# Patient Record
Sex: Female | Born: 1945 | Race: White | Hispanic: No | Marital: Married | Smoking: Never smoker
Health system: Southern US, Community
[De-identification: ages and names within clinical notes are randomized; demographics above are authoritative.]

## PROBLEM LIST (undated history)

## (undated) DIAGNOSIS — I639 Cerebral infarction, unspecified: Secondary | ICD-10-CM

## (undated) HISTORY — PX: APPENDECTOMY: SHX54

---

## 2014-12-08 ENCOUNTER — Encounter: Payer: Self-pay | Admitting: Emergency Medicine

## 2014-12-08 ENCOUNTER — Ambulatory Visit: Payer: PRIVATE HEALTH INSURANCE

## 2014-12-08 ENCOUNTER — Ambulatory Visit
Admission: EM | Admit: 2014-12-08 | Discharge: 2014-12-08 | Disposition: A | Discharge status: Home / Self Care | Payer: PRIVATE HEALTH INSURANCE | Attending: Family Medicine | Admitting: Family Medicine

## 2014-12-08 DIAGNOSIS — S39012A Strain of muscle, fascia and tendon of lower back, initial encounter: Secondary | ICD-10-CM | POA: Diagnosis not present

## 2014-12-08 DIAGNOSIS — T148XXA Other injury of unspecified body region, initial encounter: Secondary | ICD-10-CM

## 2014-12-08 DIAGNOSIS — T148 Other injury of unspecified body region: Secondary | ICD-10-CM | POA: Diagnosis not present

## 2014-12-08 HISTORY — DX: Cerebral infarction, unspecified: I63.9

## 2014-12-08 MED ORDER — KETOROLAC TROMETHAMINE 10 MG PO TABS: 10.0000 mg | ORAL_TABLET | Freq: Four times a day (QID) | ORAL | Status: AC | PRN

## 2014-12-08 MED ORDER — KETOROLAC TROMETHAMINE 60 MG/2ML IM SOLN
30.0000 mg | Freq: Once | INTRAMUSCULAR | Status: AC
  Administered 2014-12-08: 30 mg via INTRAMUSCULAR

## 2014-12-08 MED ORDER — METAXALONE 800 MG PO TABS: 800.0000 mg | ORAL_TABLET | Freq: Three times a day (TID) | ORAL | Status: AC

## 2014-12-08 NOTE — ED Provider Notes (Signed)
CSN: [id number]     Arrival date & time 12/08/14  1122 History   First MD Initiated Contact with Patient 12/08/14 1200     Chief Complaint  Patient presents with  . Back Pain   (Consider location/radiation/quality/duration/timing/severity/associated sxs/prior Treatment) HPI Comments: Married caucasian female here with spouse today for evaluation of back pain after slipping in shower falling and hitting back on the toilet.  Had stroke in 2009 slight right sided weakness.  Having back spasms worried something may have gotten broken as history osteoporosis.  Denied loss of bowel/bladder control, saddle paresthesias, or arm/leg weakness.  Denied loss of consciousness or hitting head.  The history is provided by the patient and the spouse.    Past Medical History  Diagnosis Date  . Stroke    Past Surgical History  Procedure Laterality Date  . Appendectomy     History reviewed. No pertinent family history. Social History  Substance Use Topics  . Smoking status: Never Smoker   . Smokeless tobacco: None  . Alcohol Use: Yes   OB History    No data available     Review of Systems  Constitutional: Negative for fever, chills, diaphoresis, activity change, appetite change and fatigue.  HENT: Negative for congestion, dental problem, drooling, ear discharge, ear pain, facial swelling, hearing loss, tinnitus, trouble swallowing and voice change.   Eyes: Negative for photophobia, pain, discharge, redness, itching and visual disturbance.  Respiratory: Negative for cough, choking, shortness of breath, wheezing and stridor.   Cardiovascular: Negative for chest pain and leg swelling.  Gastrointestinal: Negative for nausea, vomiting, abdominal pain, diarrhea, constipation, blood in stool and abdominal distention.  Endocrine: Negative for cold intolerance and heat intolerance.  Genitourinary: Negative for dysuria, frequency, enuresis and difficulty urinating.  Musculoskeletal: Positive for myalgias  and back pain. Negative for joint swelling, arthralgias, gait problem, neck pain and neck stiffness.  Skin: Negative for color change, pallor, rash and wound.  Allergic/Immunologic: Negative for environmental allergies and food allergies.  Neurological: Positive for weakness. Negative for dizziness, tremors, seizures, syncope, facial asymmetry, speech difficulty, light-headedness, numbness and headaches.  Hematological: Negative for adenopathy. Does not bruise/bleed easily.  Psychiatric/Behavioral: Negative for behavioral problems, confusion, sleep disturbance and agitation.    Allergies  Review of patient's allergies indicates no known allergies.  Home Medications   Prior to Admission medications   Medication Sig Start Date End Date Taking? Authorizing Corby Villasenor  ketorolac (TORADOL) 10 MG tablet Take 1 tablet (10 mg total) by mouth every 6 (six) hours as needed for moderate pain (next dose bedtime). 12/08/14   Barbaraann Barthel, NP  metaxalone (SKELAXIN) 800 MG tablet Take 1 tablet (800 mg total) by mouth 3 (three) times daily. 12/08/14   Barbaraann Barthel, NP   Meds Ordered and Administered this Visit   Medications  ketorolac (TORADOL) injection 30 mg (30 mg Intramuscular Given 12/08/14 1223)  given by RN Gwenyth Bender  BP 148/65 mmHg  Pulse 63  Temp(Src) 98.1 F (36.7 C) (Tympanic)  Resp 18  Ht 5\' 3"  (1.6 m)  Wt 180 lb (81.647 kg)  BMI 31.89 kg/m2  SpO2 99% No data found.   Physical Exam  Constitutional: She is oriented to person, place, and time. Vital signs are normal. She appears well-developed and well-nourished. No distress.  HENT:  Head: Normocephalic and atraumatic.  Right Ear: External ear normal.  Left Ear: External ear normal.  Nose: Nose normal.  Mouth/Throat: Oropharynx is clear and moist. No oropharyngeal exudate.  Eyes: Conjunctivae,  EOM and lids are normal. Pupils are equal, round, and reactive to light. Right eye exhibits no discharge. Left eye exhibits no  discharge. No scleral icterus.  Neck: Trachea normal and normal range of motion. Neck supple. No tracheal deviation present.  Cardiovascular: Normal rate, regular rhythm, normal heart sounds and intact distal pulses.  Exam reveals no gallop and no friction rub.   No murmur heard. Pulmonary/Chest: Effort normal and breath sounds normal. No stridor. No respiratory distress. She has no wheezes. She has no rales.  Abdominal: Soft. Bowel sounds are normal. She exhibits no distension.  Musculoskeletal: She exhibits tenderness. She exhibits no edema.       Right shoulder: Normal.       Left shoulder: Normal.       Right elbow: Normal.      Left elbow: Normal.       Right wrist: Normal.       Left wrist: Normal.       Right hip: Normal.       Left hip: Normal.       Right knee: Normal.       Left knee: Normal.       Right ankle: Normal.       Left ankle: Normal.       Cervical back: Normal.       Thoracic back: She exhibits decreased range of motion, tenderness, pain and spasm. She exhibits no bony tenderness, no swelling, no edema, no deformity, no laceration and normal pulse.       Lumbar back: She exhibits decreased range of motion, tenderness, pain and spasm. She exhibits no bony tenderness, no swelling, no edema, no deformity, no laceration and normal pulse.       Back:       Right forearm: Normal.       Left forearm: Normal.       Right hand: Normal.       Left hand: Normal.       Right lower leg: Normal.       Left lower leg: Normal.  Patient in wheelchair during evaluation; ambulated to bathroom with assist of spouse; pain with flexion, rotation mid and low back; gait slow normal heel/toe no limping  Lymphadenopathy:    She has no cervical adenopathy.  Neurological: She is alert and oriented to person, place, and time. She has normal reflexes. She exhibits normal muscle tone. Coordination normal.  Skin: Skin is warm, dry and intact. No rash noted. She is not diaphoretic. No  erythema. No pallor.  Psychiatric: She has a normal mood and affect. Her speech is normal and behavior is normal. Judgment and thought content normal. Cognition and memory are normal.  Nursing note and vitals reviewed.   ED Course  Procedures (including critical care time)  Labs Review Labs Reviewed - No data to display  Imaging Review Dg Thoracic Spine 2 View  12/08/2014   CLINICAL DATA:  Larey Seat in bathroom today, injured right flank and rib area  EXAM: THORACIC SPINE 2 VIEWS  COMPARISON:  None.  FINDINGS: Multilevel degenerative disc disease throughout the thoracic spine with no evidence of fracture. No paraspinous hematoma. Normal alignment.  IMPRESSION: No acute findings.   Electronically Signed   By: Esperanza Heir M.D.   On: 12/08/2014 13:13   Dg Lumbar Spine Complete  12/08/2014   CLINICAL DATA:  Larey Seat on bathroom today injuring right flank/ribs.  EXAM: LUMBAR SPINE - COMPLETE 4+ VIEW  COMPARISON:  None.  FINDINGS: Diffuse  decreased bone mineralization. Mild curvature of the lumbar spine convex right. Vertebral body heights are within normal. There is mild spondylosis throughout the lumbar spine to include facet arthropathy over the lower lumbar spine. Moderate disc space narrowing with endplate sclerosis at the L5-S1 level. Mild degenerate change of the hips. There is atherosclerotic plaque of the abdominal aorta and iliac arteries.  IMPRESSION: No acute findings.  Mild spondylosis of the lumbar spine with disc disease at the L5-S1 level.   Electronically Signed   By: Elberta Fortis M.D.   On: 12/08/2014 13:11   Kotzbauer, Allyn Kenner (M.D.) - Tue Nov 13, 2009 12:00 AM PDT  LUMBAR SPINE   ** HISTORY **:  Low back pain   ** FINDINGS **:  The bones are osteopenic. No definite fracture or other acute  bony process identified. Alignment grossly maintained. Moderate  degenerative facet change at the L5-S1 level. Moderate disk space  narrowing also at this level. Mild degenerative  endplate change  throughout. Atherosclerosis noted in the abdominal aorta.   ** IMPRESSION **:  No acute process identified. Osteopenia. Moderate degenerative  change L5-S1. Aortic atherosclerosis.     Barnet Glasgow MD   DD: 11/13/2009 DT: 11/13/2009 [facility]  1320 Patient reported pain improved after toradol injection.  Avoid taking tylenol, motrin, advil, aleve, motrin, naproxen today.  Discussed xray results negative for fracture or dislocation with patient and spouse.  Given copy of xray report.  Results stable from previous 2011.  If no improvement or worsening of pain follow up with PCM for reimaging 7-10 days.  Patient traveling home via airplane tomorrow to [place].  Patient and spouse verbalized understanding of information/instructions and had no further questions at this time.   MDM   1. Contusion   2. Low back strain, initial encounter    Plan: 1. Test/x-ray results and diagnosis reviewed with patient and spouse 2. rx as per orders; risks, benefits, potential side effects reviewed with patient and spouse 3. Recommend supportive treatment with ice/heat, stretches, toradol and skelaxin 4. F/u prn if symptoms worsen or don't improve  For acute pain, rest, and intermittent application of heat (do not sleep on heating pad).  I discussed longer-term treatment plan of PRN toradol 10mg  po QID starting at bedtime tonight.  Skelaxin 800mg  po TID prn muscle spasms avoid alcohol intake and driving while on skelaxin as may cause drowsiness.  Patient had relief with IM Toradol in clinic today and ice.  I discussed a home back care exercise program with a strengthening and flexibility exercise.  Patient given [employer] handout on contusion and low back/mid back strain with rehab exercises.  Proper avoidance of heavy lifting discussed.  Consider physical therapy or chiropractic care and radiology if not improving and follow up with PCM next week.  Call or return to clinic as needed if these symptoms  worsen or fail to improve as anticipated especially leg weakness, loss of bowel/bladder control or saddle paresthesias.   Patient and spouse verbalized understanding of instructions/information and agreed with plan of care and had no further questions at this time.  P2:  Injury Prevention, fitness   Barbaraann Barthel, NP 12/08/14 1555

## 2014-12-08 NOTE — ED Notes (Signed)
Pt fell in bathroom hitting left upper back on a toilet

## 2014-12-08 NOTE — Discharge Instructions (Signed)
Back Exercises These exercises may help you when beginning to rehabilitate your injury. Your symptoms may resolve with or without further involvement from your physician, physical therapist or athletic trainer. While completing these exercises, remember:   Restoring tissue flexibility helps normal motion to return to the joints. This allows healthier, less painful movement and activity.  An effective stretch should be held for at least 30 seconds.  A stretch should never be painful. You should only feel a gentle lengthening or release in the stretched tissue. STRETCH - Extension, Prone on Elbows   Lie on your stomach on the floor, a bed will be too soft. Place your palms about shoulder width apart and at the height of your head.  Place your elbows under your shoulders. If this is too painful, stack pillows under your chest.  Allow your body to relax so that your hips drop lower and make contact more completely with the floor.  Hold this position for __________ seconds.  Slowly return to lying flat on the floor. Repeat __________ times. Complete this exercise __________ times per day.  RANGE OF MOTION - Extension, Prone Press Ups   Lie on your stomach on the floor, a bed will be too soft. Place your palms about shoulder width apart and at the height of your head.  Keeping your back as relaxed as possible, slowly straighten your elbows while keeping your hips on the floor. You may adjust the placement of your hands to maximize your comfort. As you gain motion, your hands will come more underneath your shoulders.  Hold this position __________ seconds.  Slowly return to lying flat on the floor. Repeat __________ times. Complete this exercise __________ times per day.  RANGE OF MOTION- Quadruped, Neutral Spine   Assume a hands and knees position on a firm surface. Keep your hands under your shoulders and your knees under your hips. You may place padding under your knees for  comfort.  Drop your head and point your tail bone toward the ground below you. This will round out your low back like an angry cat. Hold this position for __________ seconds.  Slowly lift your head and release your tail bone so that your back sags into a large arch, like an old horse.  Hold this position for __________ seconds.  Repeat this until you feel limber in your low back.  Now, find your "sweet spot." This will be the most comfortable position somewhere between the two previous positions. This is your neutral spine. Once you have found this position, tense your stomach muscles to support your low back.  Hold this position for __________ seconds. Repeat __________ times. Complete this exercise __________ times per day.  STRETCH - Flexion, Single Knee to Chest   Lie on a firm bed or floor with both legs extended in front of you.  Keeping one leg in contact with the floor, bring your opposite knee to your chest. Hold your leg in place by either grabbing behind your thigh or at your knee.  Pull until you feel a gentle stretch in your low back. Hold __________ seconds.  Slowly release your grasp and repeat the exercise with the opposite side. Repeat __________ times. Complete this exercise __________ times per day.  STRETCH - Hamstrings, Standing  Stand or sit and extend your right / left leg, placing your foot on a chair or foot stool  Keeping a slight arch in your low back and your hips straight forward.  Lead with your chest and  lean forward at the waist until you feel a gentle stretch in the back of your right / left knee or thigh. (When done correctly, this exercise requires leaning only a small distance.)  Hold this position for __________ seconds. Repeat __________ times. Complete this stretch __________ times per day. STRENGTHENING - Deep Abdominals, Pelvic Tilt   Lie on a firm bed or floor. Keeping your legs in front of you, bend your knees so they are both pointed  toward the ceiling and your feet are flat on the floor.  Tense your lower abdominal muscles to press your low back into the floor. This motion will rotate your pelvis so that your tail bone is scooping upwards rather than pointing at your feet or into the floor.  With a gentle tension and even breathing, hold this position for __________ seconds. Repeat __________ times. Complete this exercise __________ times per day.  STRENGTHENING - Abdominals, Crunches   Lie on a firm bed or floor. Keeping your legs in front of you, bend your knees so they are both pointed toward the ceiling and your feet are flat on the floor. Cross your arms over your chest.  Slightly tip your chin down without bending your neck.  Tense your abdominals and slowly lift your trunk high enough to just clear your shoulder blades. Lifting higher can put excessive stress on the low back and does not further strengthen your abdominal muscles.  Control your return to the starting position. Repeat __________ times. Complete this exercise __________ times per day.  STRENGTHENING - Quadruped, Opposite UE/LE Lift   Assume a hands and knees position on a firm surface. Keep your hands under your shoulders and your knees under your hips. You may place padding under your knees for comfort.  Find your neutral spine and gently tense your abdominal muscles so that you can maintain this position. Your shoulders and hips should form a rectangle that is parallel with the floor and is not twisted.  Keeping your trunk steady, lift your right hand no higher than your shoulder and then your left leg no higher than your hip. Make sure you are not holding your breath. Hold this position __________ seconds.  Continuing to keep your abdominal muscles tense and your back steady, slowly return to your starting position. Repeat with the opposite arm and leg. Repeat __________ times. Complete this exercise __________ times per day.Contusion A  contusion is a deep bruise. Contusions are the result of an injury that caused bleeding under the skin. The contusion may turn blue, purple, or yellow. Minor injuries will give you a painless contusion, but more severe contusions may stay painful and swollen for a few weeks.  CAUSES  A contusion is usually caused by a blow, trauma, or direct force to an area of the body. SYMPTOMS  Swelling and redness of the injured area. Bruising of the injured area. Tenderness and soreness of the injured area. Pain. DIAGNOSIS  The diagnosis can be made by taking a history and physical exam. An X-ray, CT scan, or MRI may be needed to determine if there were any associated injuries, such as fractures. TREATMENT  Specific treatment will depend on what area of the body was injured. In general, the best treatment for a contusion is resting, icing, elevating, and applying cold compresses to the injured area. Over-the-counter medicines may also be recommended for pain control. Ask your caregiver what the best treatment is for your contusion. HOME CARE INSTRUCTIONS  Put ice on the injured  area. Put ice in a plastic bag. Place a towel between your skin and the bag. Leave the ice on for 15-20 minutes, 3-4 times a day, or as directed by your health care provider. Only take over-the-counter or prescription medicines for pain, discomfort, or fever as directed by your caregiver. Your caregiver may recommend avoiding anti-inflammatory medicines (aspirin, ibuprofen, and naproxen) for 48 hours because these medicines may increase bruising. Rest the injured area. If possible, elevate the injured area to reduce swelling. SEEK IMMEDIATE MEDICAL CARE IF:  You have increased bruising or swelling. You have pain that is getting worse. Your swelling or pain is not relieved with medicines. MAKE SURE YOU:  Understand these instructions. Will watch your condition. Will get help right away if you are not doing well or get  worse. Document Released: 12/18/2004 Document Revised: 03/15/2013 Document Reviewed: 01/13/2011 [facility] Patient Information 2015 [place], [place]. This information is not intended to replace advice given to you by your health care provider. Make sure you discuss any questions you have with your health care provider. Document Released: 03/28/2005 Document Revised: 06/02/2011 Document Reviewed: 06/22/2008 [facility] Patient Information 2015 [place], [place]. This information is not intended to replace advice given to you by your health care provider. Make sure you discuss any questions you have with your health care provider. Low Back Strain with Rehab A strain is an injury in which a tendon or muscle is torn. The muscles and tendons of the lower back are vulnerable to strains. However, these muscles and tendons are very strong and require a great force to be injured. Strains are classified into three categories. Grade 1 strains cause pain, but the tendon is not lengthened. Grade 2 strains include a lengthened ligament, due to the ligament being stretched or partially ruptured. With grade 2 strains there is still function, although the function may be decreased. Grade 3 strains involve a complete tear of the tendon or muscle, and function is usually impaired. SYMPTOMS  Pain in the lower back. Pain that affects one side more than the other. Pain that gets worse with movement and may be felt in the hip, buttocks, or back of the thigh. Muscle spasms of the muscles in the back. Swelling along the muscles of the back. Loss of strength of the back muscles. Crackling sound (crepitation) when the muscles are touched. CAUSES  Lower back strains occur when a force is placed on the muscles or tendons that is greater than they can handle. Common causes of injury include: Prolonged overuse of the muscle-tendon units in the lower back, usually from incorrect posture. A single violent injury or force applied to the  back. RISK INCREASES WITH: Sports that involve twisting forces on the spine or a lot of bending at the waist (football, rugby, weightlifting, bowling, golf, tennis, speed skating, racquetball, swimming, running, gymnastics, diving). Poor strength and flexibility. Failure to warm up properly before activity. Family history of lower back pain or disk disorders. Previous back injury or surgery (especially fusion). Poor posture with lifting, especially heavy objects. Prolonged sitting, especially with poor posture. PREVENTION  Learn and use proper posture when sitting or lifting (maintain proper posture when sitting, lift using the knees and legs, not at the waist). Warm up and stretch properly before activity. Allow for adequate recovery between workouts. Maintain physical fitness: Strength, flexibility, and endurance. Cardiovascular fitness. PROGNOSIS  If treated properly, lower back strains usually heal within 6 weeks. RELATED COMPLICATIONS  Recurring symptoms, resulting in a chronic problem. Chronic inflammation, scarring,  and partial muscle-tendon tear. Delayed healing or resolution of symptoms. Prolonged disability. TREATMENT  Treatment first involves the use of ice and medicine, to reduce pain and inflammation. The use of strengthening and stretching exercises may help reduce pain with activity. These exercises may be performed at home or with a therapist. Severe injuries may require referral to a therapist for further evaluation and treatment, such as ultrasound. Your caregiver may advise that you wear a back brace or corset, to help reduce pain and discomfort. Often, prolonged bed rest results in greater harm then benefit. Corticosteroid injections may be recommended. However, these should be reserved for the most serious cases. It is important to avoid using your back when lifting objects. At night, sleep on your back on a firm mattress with a pillow placed under your knees. If  non-surgical treatment is unsuccessful, surgery may be needed.  MEDICATION  If pain medicine is needed, nonsteroidal anti-inflammatory medicines (aspirin and ibuprofen), or other minor pain relievers (acetaminophen), are often advised. Do not take pain medicine for 7 days before surgery. Prescription pain relievers may be given, if your caregiver thinks they are needed. Use only as directed and only as much as you need. Ointments applied to the skin may be helpful. Corticosteroid injections may be given by your caregiver. These injections should be reserved for the most serious cases, because they may only be given a certain number of times. HEAT AND COLD Cold treatment (icing) should be applied for 10 to 15 minutes every 2 to 3 hours for inflammation and pain, and immediately after activity that aggravates your symptoms. Use ice packs or an ice massage. Heat treatment may be used before performing stretching and strengthening activities prescribed by your caregiver, physical therapist, or athletic trainer. Use a heat pack or a warm water soak. SEEK MEDICAL CARE IF:  Symptoms get worse or do not improve in 2 to 4 weeks, despite treatment. You develop numbness, weakness, or loss of bowel or bladder function. New, unexplained symptoms develop. (Drugs used in treatment may produce side effects.) EXERCISES  RANGE OF MOTION (ROM) AND STRETCHING EXERCISES - Low Back Strain Most people with lower back pain will find that their symptoms get worse with excessive bending forward (flexion) or arching at the lower back (extension). The exercises which will help resolve your symptoms will focus on the opposite motion.  Your physician, physical therapist or athletic trainer will help you determine which exercises will be most helpful to resolve your lower back pain. Do not complete any exercises without first consulting with your caregiver. Discontinue any exercises which make your symptoms worse until you speak  to your caregiver.  If you have pain, numbness or tingling which travels down into your buttocks, leg or foot, the goal of the therapy is for these symptoms to move closer to your back and eventually resolve. Sometimes, these leg symptoms will get better, but your lower back pain may worsen. This is typically an indication of progress in your rehabilitation. Be very alert to any changes in your symptoms and the activities in which you participated in the 24 hours prior to the change. Sharing this information with your caregiver will allow him/her to most efficiently treat your condition. These exercises may help you when beginning to rehabilitate your injury. Your symptoms may resolve with or without further involvement from your physician, physical therapist or athletic trainer. While completing these exercises, remember: Restoring tissue flexibility helps normal motion to return to the joints. This allows healthier, less  painful movement and activity. An effective stretch should be held for at least 30 seconds. A stretch should never be painful. You should only feel a gentle lengthening or release in the stretched tissue. FLEXION RANGE OF MOTION AND STRETCHING EXERCISES: STRETCH - Flexion, Single Knee to Chest  Lie on a firm bed or floor with both legs extended in front of you. Keeping one leg in contact with the floor, bring your opposite knee to your chest. Hold your leg in place by either grabbing behind your thigh or at your knee. Pull until you feel a gentle stretch in your lower back. Hold __________ seconds. Slowly release your grasp and repeat the exercise with the opposite side. Repeat __________ times. Complete this exercise __________ times per day.  STRETCH - Flexion, Double Knee to Chest  Lie on a firm bed or floor with both legs extended in front of you. Keeping one leg in contact with the floor, bring your opposite knee to your chest. Tense your stomach muscles to support your back  and then lift your other knee to your chest. Hold your legs in place by either grabbing behind your thighs or at your knees. Pull both knees toward your chest until you feel a gentle stretch in your lower back. Hold __________ seconds. Tense your stomach muscles and slowly return one leg at a time to the floor. Repeat __________ times. Complete this exercise __________ times per day.  STRETCH - Low Trunk Rotation Lie on a firm bed or floor. Keeping your legs in front of you, bend your knees so they are both pointed toward the ceiling and your feet are flat on the floor. Extend your arms out to the side. This will stabilize your upper body by keeping your shoulders in contact with the floor. Gently and slowly drop both knees together to one side until you feel a gentle stretch in your lower back. Hold for __________ seconds. Tense your stomach muscles to support your lower back as you bring your knees back to the starting position. Repeat the exercise to the other side. Repeat __________ times. Complete this exercise __________ times per day  EXTENSION RANGE OF MOTION AND FLEXIBILITY EXERCISES: STRETCH - Extension, Prone on Elbows  Lie on your stomach on the floor, a bed will be too soft. Place your palms about shoulder width apart and at the height of your head. Place your elbows under your shoulders. If this is too painful, stack pillows under your chest. Allow your body to relax so that your hips drop lower and make contact more completely with the floor. Hold this position for __________ seconds. Slowly return to lying flat on the floor. Repeat __________ times. Complete this exercise __________ times per day.  RANGE OF MOTION - Extension, Prone Press Ups Lie on your stomach on the floor, a bed will be too soft. Place your palms about shoulder width apart and at the height of your head. Keeping your back as relaxed as possible, slowly straighten your elbows while keeping your hips on the  floor. You may adjust the placement of your hands to maximize your comfort. As you gain motion, your hands will come more underneath your shoulders. Hold this position __________ seconds. Slowly return to lying flat on the floor. Repeat __________ times. Complete this exercise __________ times per day.  RANGE OF MOTION- Quadruped, Neutral Spine  Assume a hands and knees position on a firm surface. Keep your hands under your shoulders and your knees under your hips.  You may place padding under your knees for comfort. Drop your head and point your tail bone toward the ground below you. This will round out your lower back like an angry cat. Hold this position for __________ seconds. Slowly lift your head and release your tail bone so that your back sags into a large arch, like an old horse. Hold this position for __________ seconds. Repeat this until you feel limber in your lower back. Now, find your "sweet spot." This will be the most comfortable position somewhere between the two previous positions. This is your neutral spine. Once you have found this position, tense your stomach muscles to support your lower back. Hold this position for __________ seconds. Repeat __________ times. Complete this exercise __________ times per day.  STRENGTHENING EXERCISES - Low Back Strain These exercises may help you when beginning to rehabilitate your injury. These exercises should be done near your "sweet spot." This is the neutral, low-back arch, somewhere between fully rounded and fully arched, that is your least painful position. When performed in this safe range of motion, these exercises can be used for people who have either a flexion or extension based injury. These exercises may resolve your symptoms with or without further involvement from your physician, physical therapist or athletic trainer. While completing these exercises, remember:  Muscles can gain both the endurance and the strength needed for  everyday activities through controlled exercises. Complete these exercises as instructed by your physician, physical therapist or athletic trainer. Increase the resistance and repetitions only as guided. You may experience muscle soreness or fatigue, but the pain or discomfort you are trying to eliminate should never worsen during these exercises. If this pain does worsen, stop and make certain you are following the directions exactly. If the pain is still present after adjustments, discontinue the exercise until you can discuss the trouble with your caregiver. STRENGTHENING - Deep Abdominals, Pelvic Tilt Lie on a firm bed or floor. Keeping your legs in front of you, bend your knees so they are both pointed toward the ceiling and your feet are flat on the floor. Tense your lower abdominal muscles to press your lower back into the floor. This motion will rotate your pelvis so that your tail bone is scooping upwards rather than pointing at your feet or into the floor. With a gentle tension and even breathing, hold this position for __________ seconds. Repeat __________ times. Complete this exercise __________ times per day.  STRENGTHENING - Abdominals, Crunches  Lie on a firm bed or floor. Keeping your legs in front of you, bend your knees so they are both pointed toward the ceiling and your feet are flat on the floor. Cross your arms over your chest. Slightly tip your chin down without bending your neck. Tense your abdominals and slowly lift your trunk high enough to just clear your shoulder blades. Lifting higher can put excessive stress on the lower back and does not further strengthen your abdominal muscles. Control your return to the starting position. Repeat __________ times. Complete this exercise __________ times per day.  STRENGTHENING - Quadruped, Opposite UE/LE Lift  Assume a hands and knees position on a firm surface. Keep your hands under your shoulders and your knees under your hips. You  may place padding under your knees for comfort. Find your neutral spine and gently tense your abdominal muscles so that you can maintain this position. Your shoulders and hips should form a rectangle that is parallel with the floor and is not  twisted. Keeping your trunk steady, lift your right hand no higher than your shoulder and then your left leg no higher than your hip. Make sure you are not holding your breath. Hold this position __________ seconds. Continuing to keep your abdominal muscles tense and your back steady, slowly return to your starting position. Repeat with the opposite arm and leg. Repeat __________ times. Complete this exercise __________ times per day.  STRENGTHENING - Lower Abdominals, Double Knee Lift Lie on a firm bed or floor. Keeping your legs in front of you, bend your knees so they are both pointed toward the ceiling and your feet are flat on the floor. Tense your abdominal muscles to brace your lower back and slowly lift both of your knees until they come over your hips. Be certain not to hold your breath. Hold __________ seconds. Using your abdominal muscles, return to the starting position in a slow and controlled manner. Repeat __________ times. Complete this exercise __________ times per day.  POSTURE AND BODY MECHANICS CONSIDERATIONS - Low Back Strain Keeping correct posture when sitting, standing or completing your activities will reduce the stress put on different body tissues, allowing injured tissues a chance to heal and limiting painful experiences. The following are general guidelines for improved posture. Your physician or physical therapist will provide you with any instructions specific to your needs. While reading these guidelines, remember: The exercises prescribed by your provider will help you have the flexibility and strength to maintain correct postures. The correct posture provides the best environment for your joints to work. All of your joints have  less wear and tear when properly supported by a spine with good posture. This means you will experience a healthier, less painful body. Correct posture must be practiced with all of your activities, especially prolonged sitting and standing. Correct posture is as important when doing repetitive low-stress activities (typing) as it is when doing a single heavy-load activity (lifting). RESTING POSITIONS Consider which positions are most painful for you when choosing a resting position. If you have pain with flexion-based activities (sitting, bending, stooping, squatting), choose a position that allows you to rest in a less flexed posture. You would want to avoid curling into a fetal position on your side. If your pain worsens with extension-based activities (prolonged standing, working overhead), avoid resting in an extended position such as sleeping on your stomach. Most people will find more comfort when they rest with their spine in a more neutral position, neither too rounded nor too arched. Lying on a non-sagging bed on your side with a pillow between your knees, or on your back with a pillow under your knees will often provide some relief. Keep in mind, being in any one position for a prolonged period of time, no matter how correct your posture, can still lead to stiffness. PROPER SITTING POSTURE In order to minimize stress and discomfort on your spine, you must sit with correct posture. Sitting with good posture should be effortless for a healthy body. Returning to good posture is a gradual process. Many people can work toward this most comfortably by using various supports until they have the flexibility and strength to maintain this posture on their own. When sitting with proper posture, your ears will fall over your shoulders and your shoulders will fall over your hips. You should use the back of the chair to support your upper back. Your lower back will be in a neutral position, just slightly arched.  You may place a small pillow  or folded towel at the base of your lower back for support.  When working at a desk, create an environment that supports good, upright posture. Without extra support, muscles tire, which leads to excessive strain on joints and other tissues. Keep these recommendations in mind: CHAIR: A chair should be able to slide under your desk when your back makes contact with the back of the chair. This allows you to work closely. The chair's height should allow your eyes to be level with the upper part of your monitor and your hands to be slightly lower than your elbows. BODY POSITION Your feet should make contact with the floor. If this is not possible, use a foot rest. Keep your ears over your shoulders. This will reduce stress on your neck and lower back. INCORRECT SITTING POSTURES  If you are feeling tired and unable to assume a healthy sitting posture, do not slouch or slump. This puts excessive strain on your back tissues, causing more damage and pain. Healthier options include: Using more support, like a lumbar pillow. Switching tasks to something that requires you to be upright or walking. Talking a brief walk. Lying down to rest in a neutral-spine position. PROLONGED STANDING WHILE SLIGHTLY LEANING FORWARD  When completing a task that requires you to lean forward while standing in one place for a long time, place either foot up on a stationary 2-4 inch high object to help maintain the best posture. When both feet are on the ground, the lower back tends to lose its slight inward curve. If this curve flattens (or becomes too large), then the back and your other joints will experience too much stress, tire more quickly, and can cause pain. CORRECT STANDING POSTURES Proper standing posture should be assumed with all daily activities, even if they only take a few moments, like when brushing your teeth. As in sitting, your ears should fall over your shoulders and your shoulders  should fall over your hips. You should keep a slight tension in your abdominal muscles to brace your spine. Your tailbone should point down to the ground, not behind your body, resulting in an over-extended swayback posture.  INCORRECT STANDING POSTURES  Common incorrect standing postures include a forward head, locked knees and/or an excessive swayback. WALKING Walk with an upright posture. Your ears, shoulders and hips should all line-up. PROLONGED ACTIVITY IN A FLEXED POSITION When completing a task that requires you to bend forward at your waist or lean over a low surface, try to find a way to stabilize 3 out of 4 of your limbs. You can place a hand or elbow on your thigh or rest a knee on the surface you are reaching across. This will provide you more stability so that your muscles do not fatigue as quickly. By keeping your knees relaxed, or slightly bent, you will also reduce stress across your lower back. CORRECT LIFTING TECHNIQUES DO :  Assume a wide stance. This will provide you more stability and the opportunity to get as close as possible to the object which you are lifting. Tense your abdominals to brace your spine. Bend at the knees and hips. Keeping your back locked in a neutral-spine position, lift using your leg muscles. Lift with your legs, keeping your back straight. Test the weight of unknown objects before attempting to lift them. Try to keep your elbows locked down at your sides in order get the best strength from your shoulders when carrying an object. Always ask for help when lifting  heavy or awkward objects. INCORRECT LIFTING TECHNIQUES DO NOT:  Lock your knees when lifting, even if it is a small object. Bend and twist. Pivot at your feet or move your feet when needing to change directions. Assume that you can safely pick up even a paper clip without proper posture. Document Released: 03/10/2005 Document Revised: 06/02/2011 Document Reviewed: 06/22/2008 [facility] Patient  Information 2015 [place], [place]. This information is not intended to replace advice given to you by your health care provider. Make sure you discuss any questions you have with your health care provider.

## 2016-03-25 IMAGING — CR DG LUMBAR SPINE COMPLETE 4+V
5 series · 5 of 5 positions shown · non-contrast
Comparison: None.

CLINICAL DATA: Fell on bathroom today injuring right flank/ribs.

EXAM:
LUMBAR SPINE - COMPLETE 4+ VIEW

[l-spine ap]
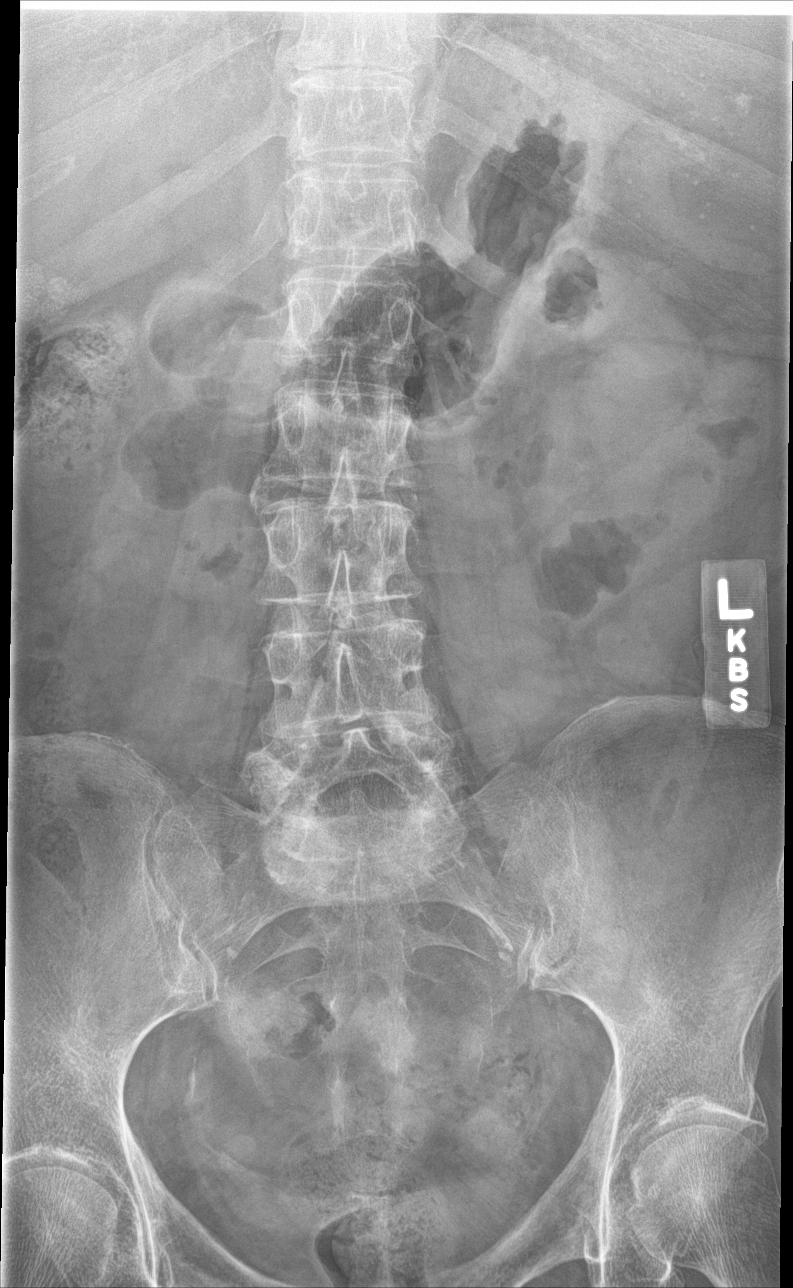

[l-spine obl (1 of 2)]
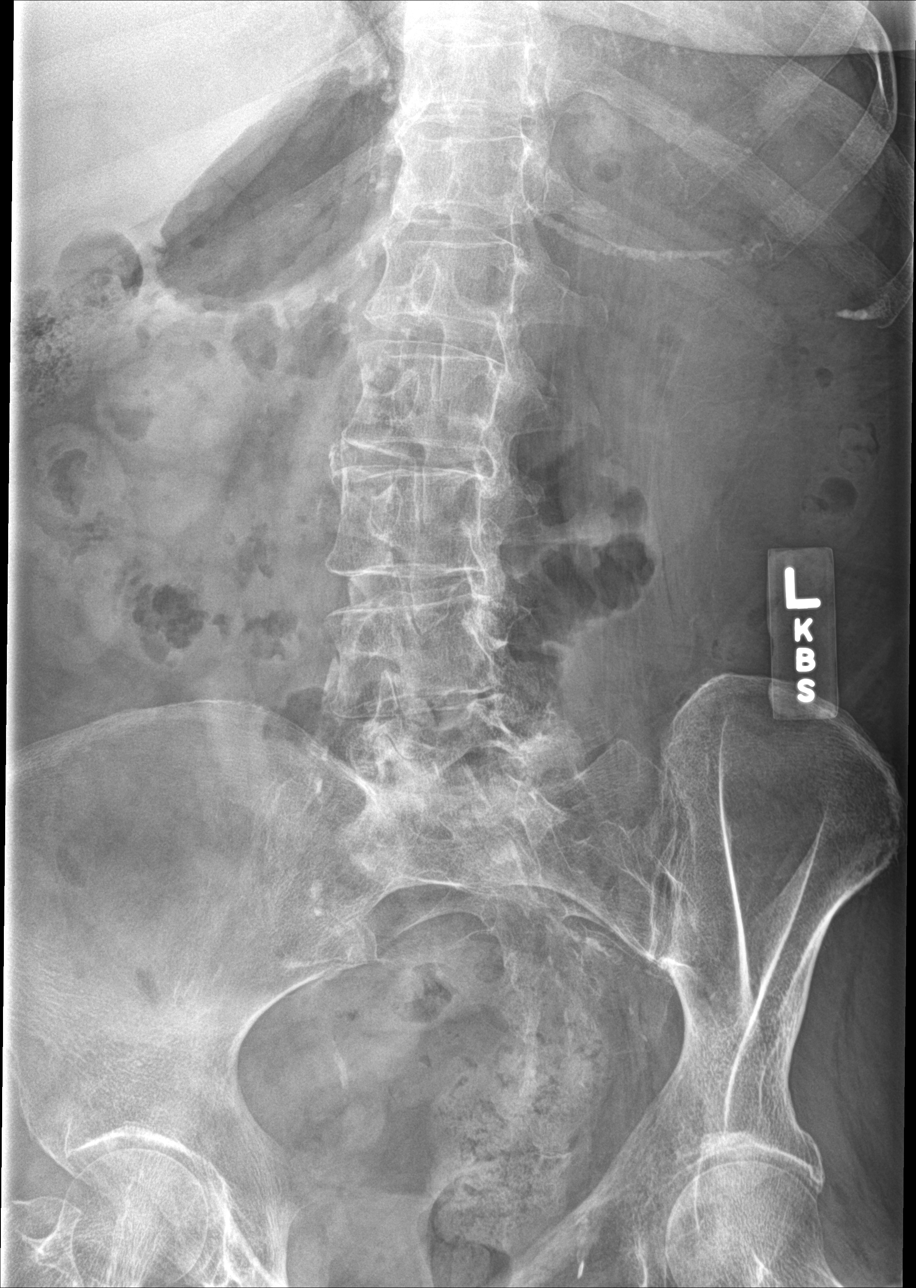

[l-spine obl (2 of 2)]
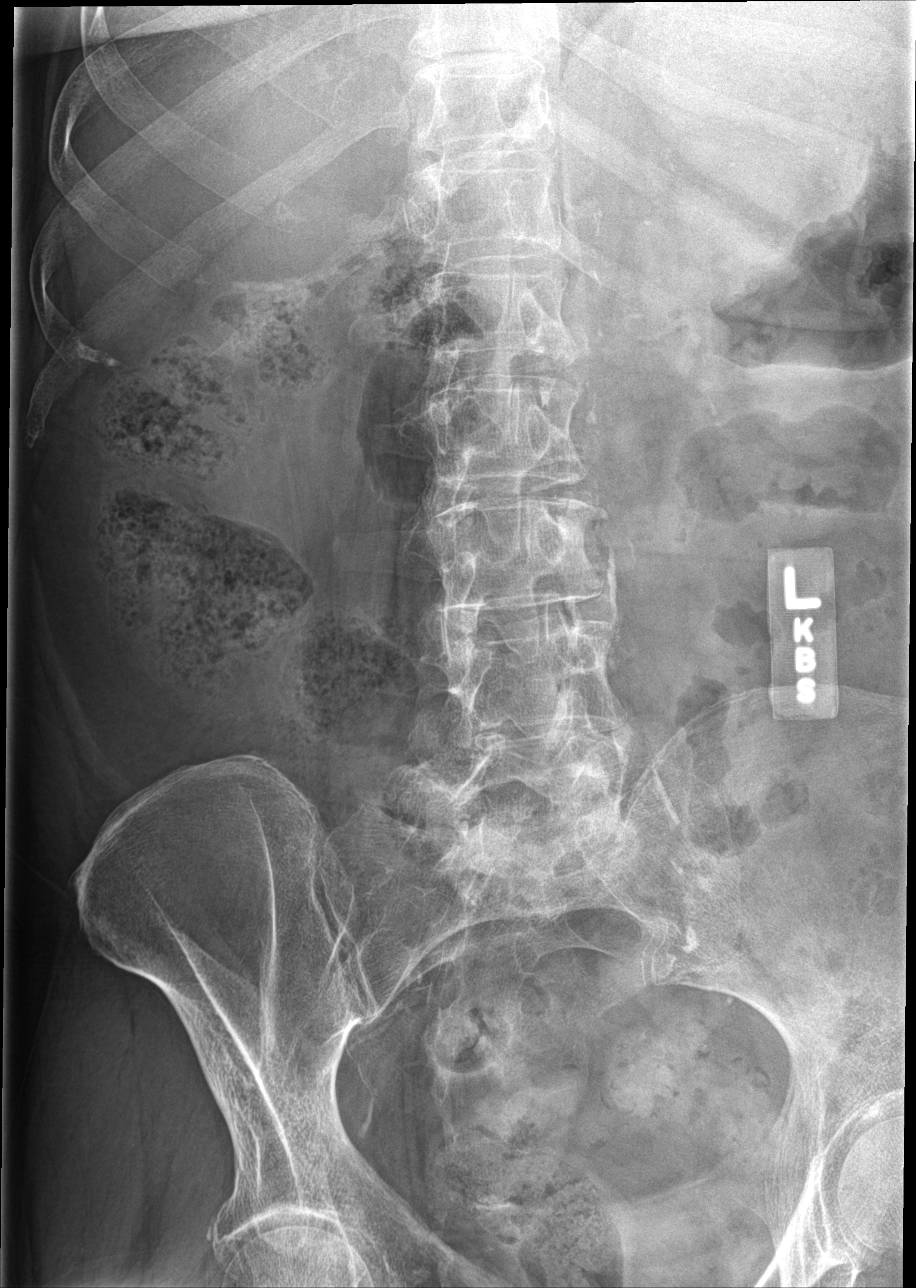

[l-spine lat (1 of 2)]
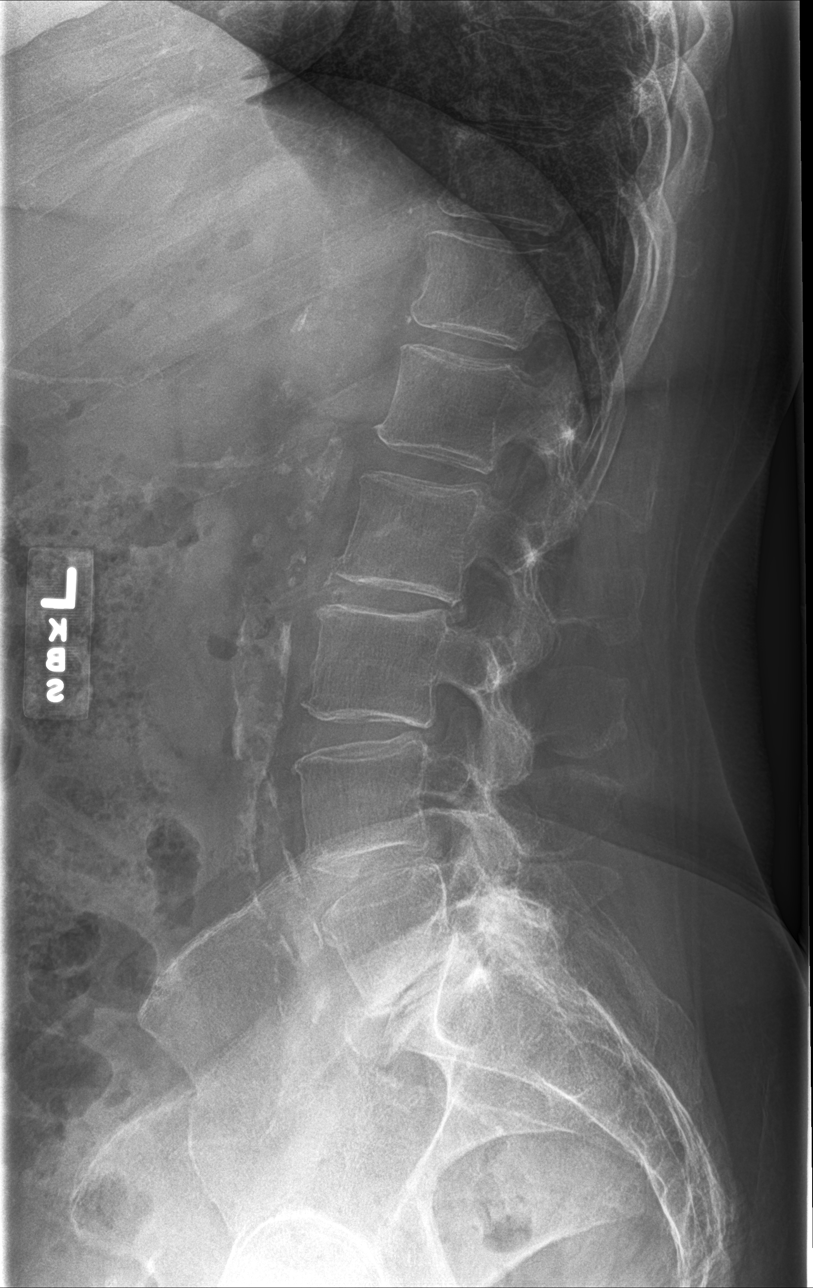

[l-spine lat (2 of 2)]
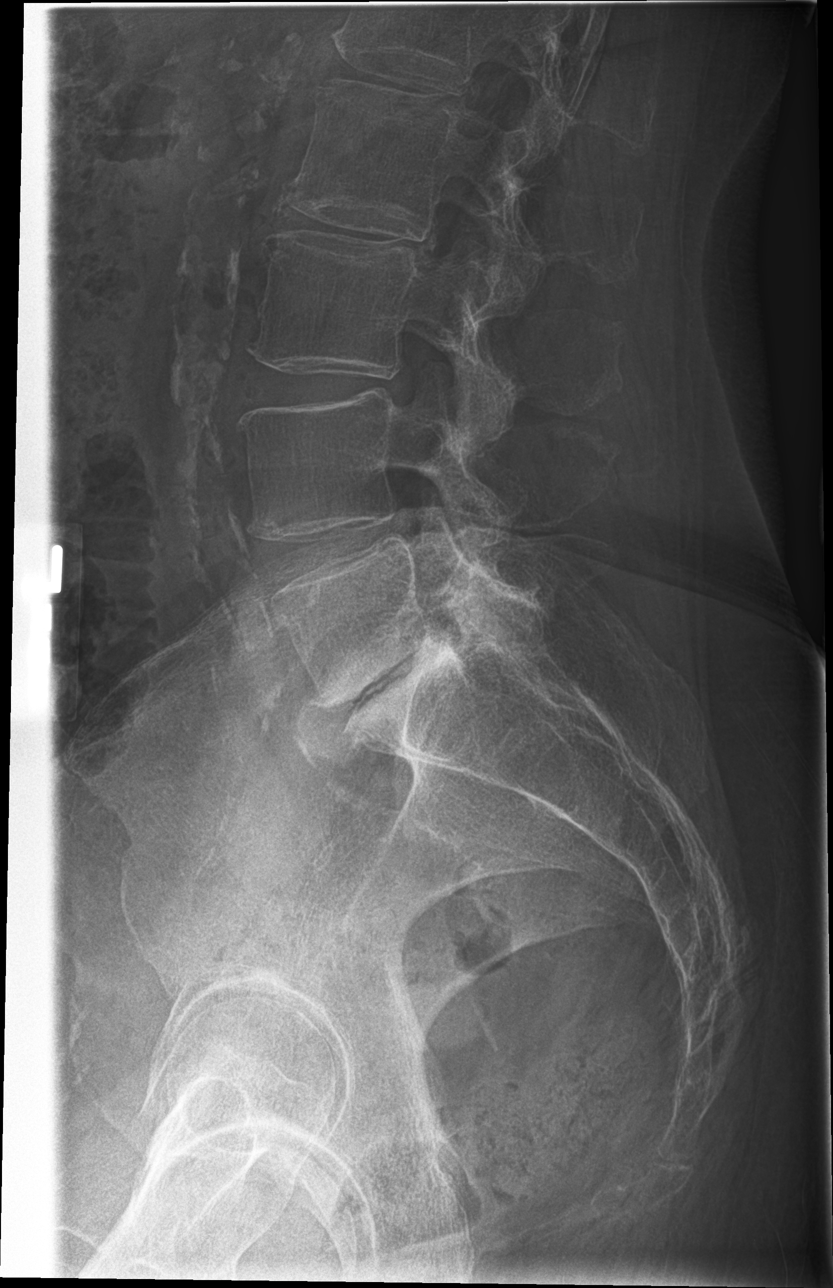

[5 of 5 positions shown; findings below may reference images not displayed]

FINDINGS: Diffuse decreased bone mineralization. Mild curvature of the lumbar
spine convex right. Vertebral body heights are within normal. There
is mild spondylosis throughout the lumbar spine to include facet
arthropathy over the lower lumbar spine. Moderate disc space
narrowing with endplate sclerosis at the L5-S1 level. Mild
degenerate change of the hips. There is atherosclerotic plaque of
the abdominal aorta and iliac arteries.
IMPRESSION: No acute findings.

Mild spondylosis of the lumbar spine with disc disease at the L5-S1
level.
